# Patient Record
Sex: Male | Born: 1985 | Race: White | Hispanic: Yes | Marital: Married
Health system: Southern US, Community
[De-identification: ages and names within clinical notes are randomized; demographics above are authoritative.]

---

## 2010-08-26 ENCOUNTER — Emergency Department (HOSPITAL_COMMUNITY): Admission: EM | Admit: 2010-08-26 | Discharge: 2010-08-26 | Payer: Self-pay | Admitting: Family Medicine

## 2011-09-23 ENCOUNTER — Other Ambulatory Visit: Payer: Self-pay | Admitting: Geriatric Medicine

## 2011-09-23 DIAGNOSIS — R1030 Lower abdominal pain, unspecified: Secondary | ICD-10-CM

## 2011-09-27 ENCOUNTER — Ambulatory Visit
Admission: RE | Admit: 2011-09-27 | Discharge: 2011-09-27 | Disposition: A | Discharge status: Home / Self Care | Payer: No Typology Code available for payment source | Source: Ambulatory Visit | Attending: Geriatric Medicine | Admitting: Geriatric Medicine

## 2011-09-27 ENCOUNTER — Other Ambulatory Visit: Payer: Self-pay | Admitting: Geriatric Medicine

## 2011-09-27 DIAGNOSIS — R1030 Lower abdominal pain, unspecified: Secondary | ICD-10-CM

## 2013-05-17 IMAGING — US US PELVIS LIMITED
1 series · 14 of 25 positions shown · non-contrast
Comparison: None.

CLINICAL DATA: Groin pain.  Question inguinal hernia.

US PELVIS LIMITED OR FOLLOW UP

[Series 1: us pelvis limited · 0.08mm/px · 14 of 27 slices shown]
[im 1/27]
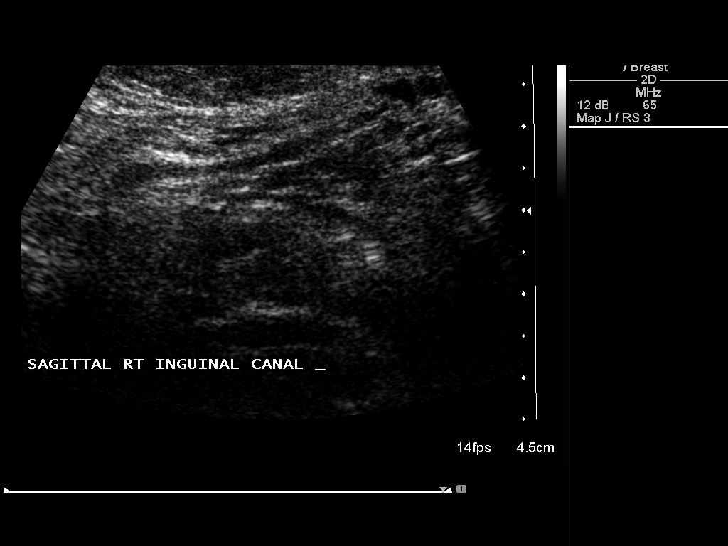
[im 3/27]
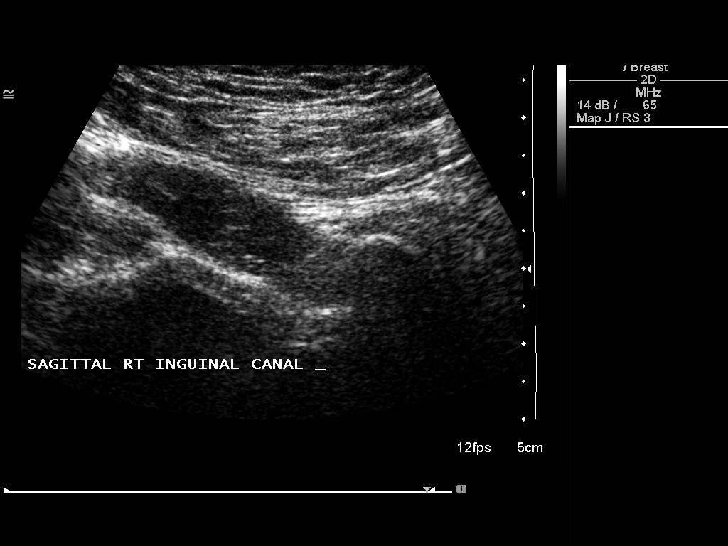
[im 5/27]
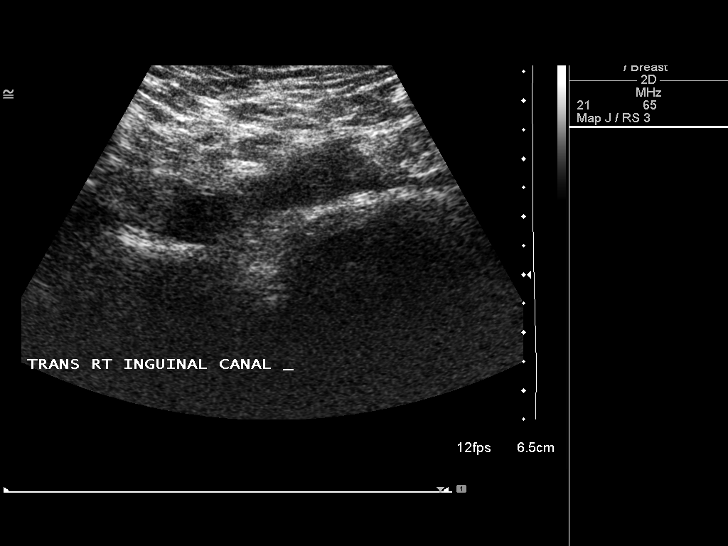
[im 7/27]
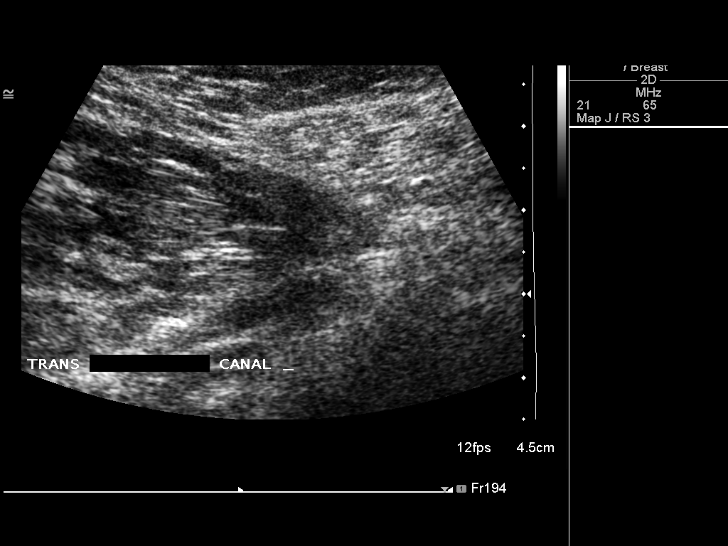
[im 9/27]
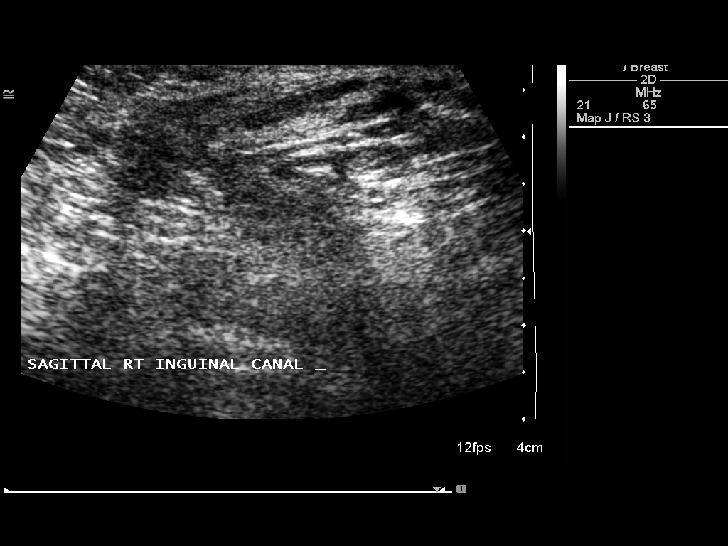
[im 10/27]
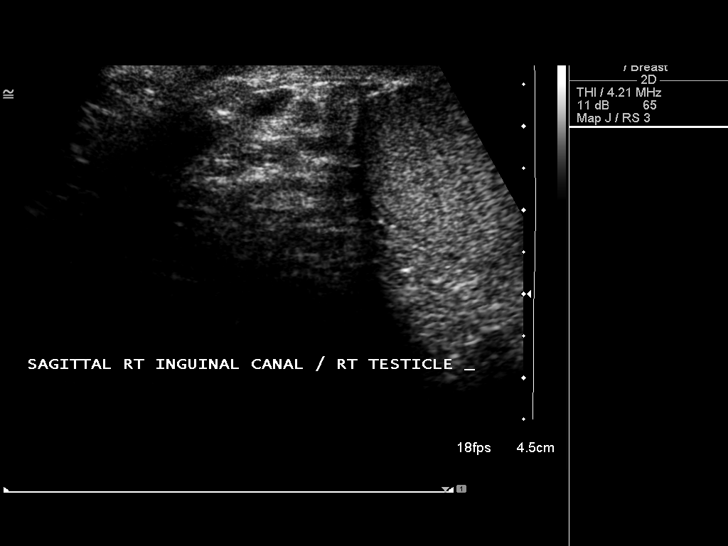
[im 12/27]
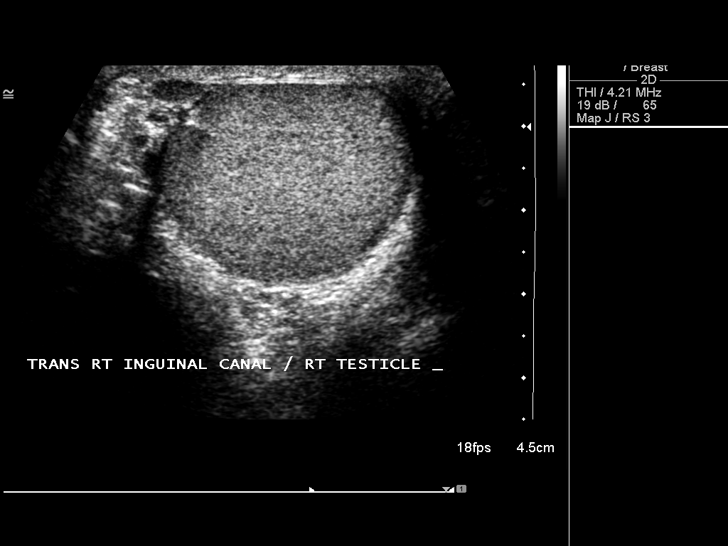
[im 15/27]
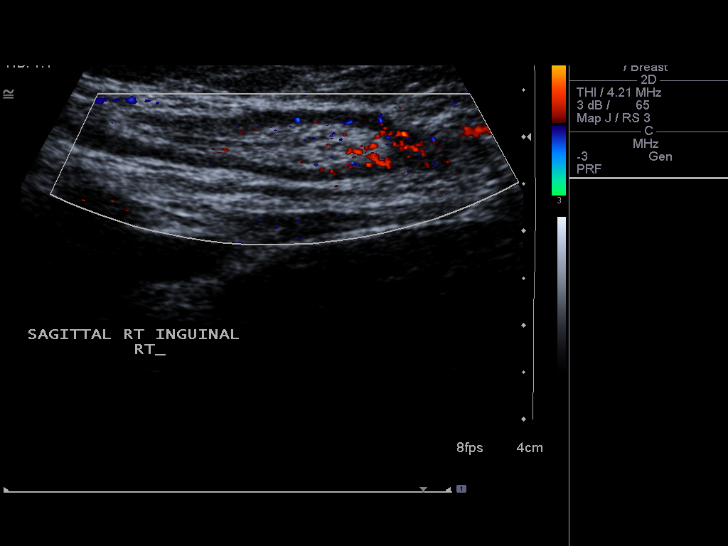
[im 17/27]
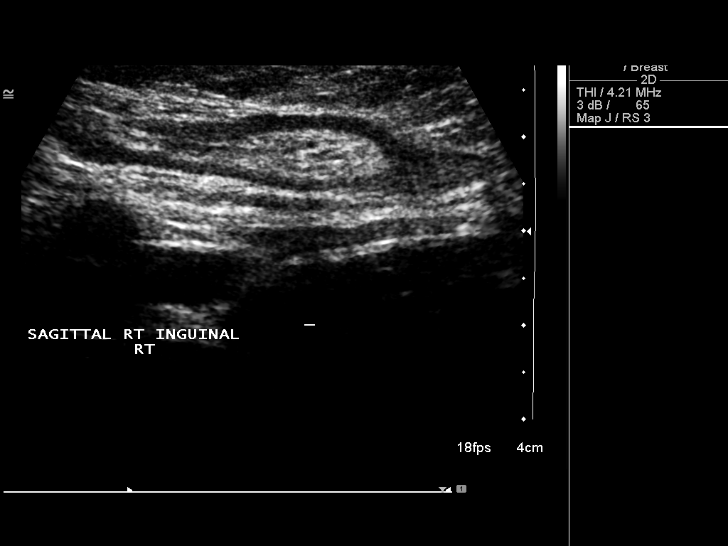
[im 18/27]
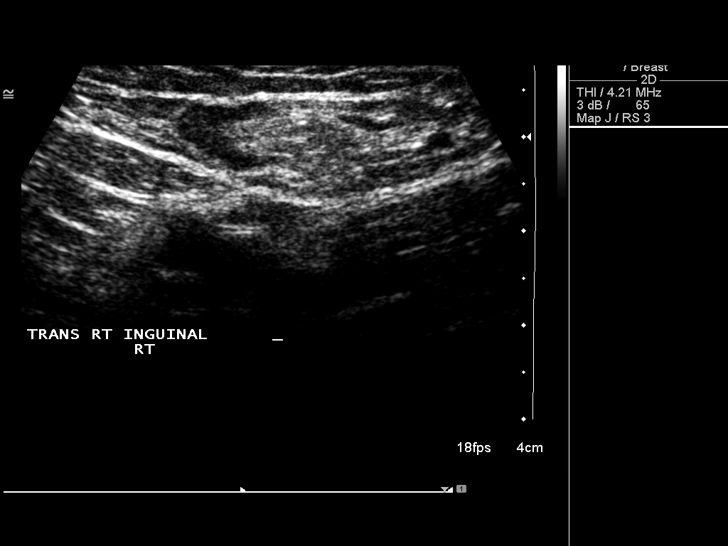
[im 20/27]
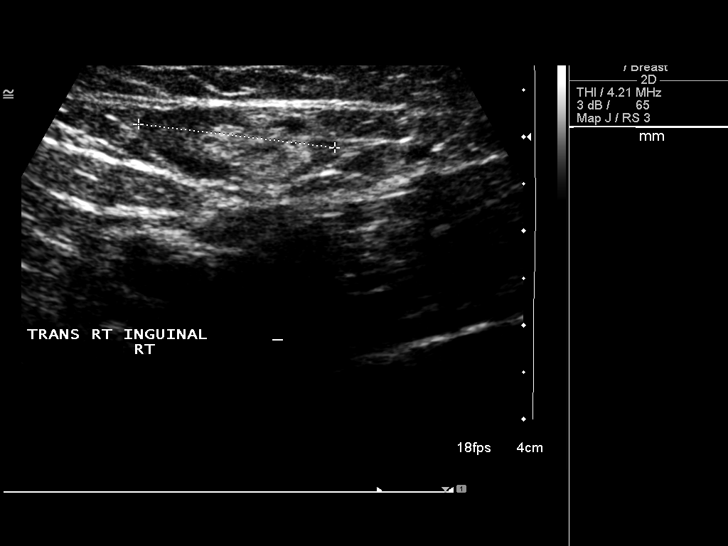
[im 22/27]
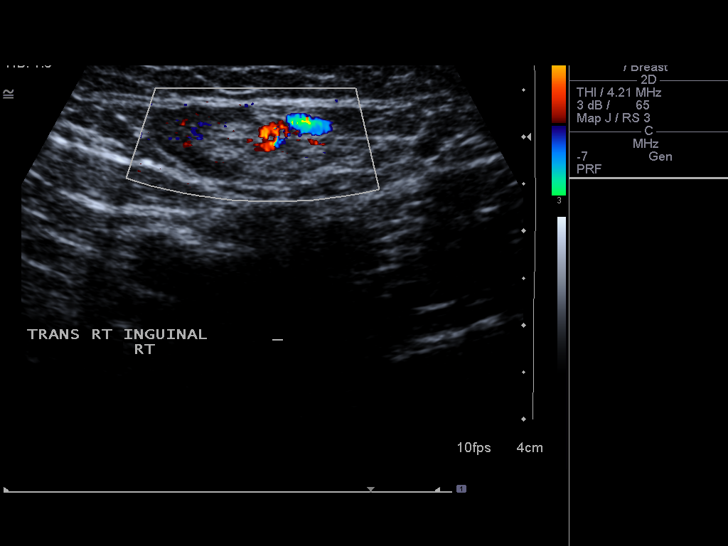
[im 24/27]
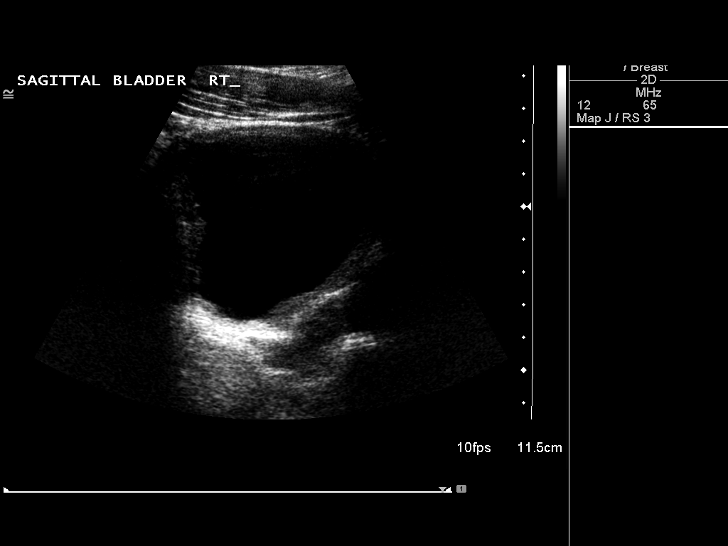
[im 27/27]
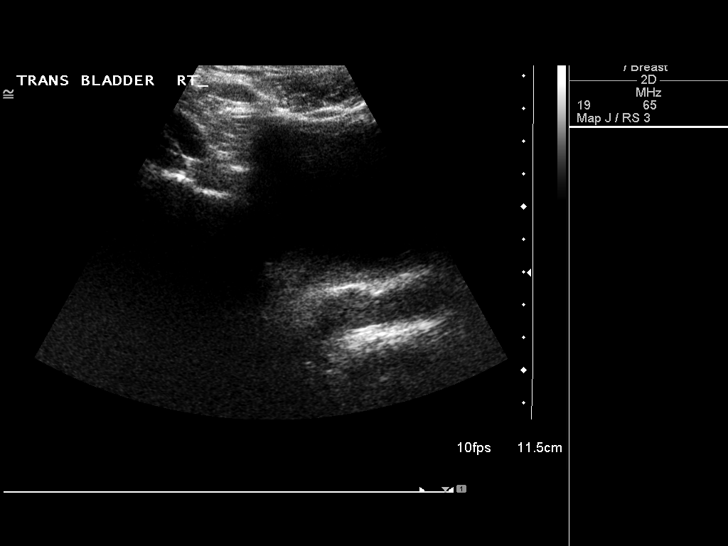

[14 of 25 positions shown; findings below may reference images not displayed]

FINDINGS: No ultrasound evidence of right inguinal hernia.
Incomplete imaging of the right testicle and urinary bladder within
normal limits.  Likely reactive right inguinal lymph node measures
8 mm and maintains its fatty hilum.
IMPRESSION: No ultrasound evidence of inguinal hernia.  Please note that
ultrasound is of low sensitivity for hernias.  If this is an
ongoing clinical concern, the test of choice is contrast enhanced
pelvic CT.

## 2019-10-10 ENCOUNTER — Other Ambulatory Visit: Payer: Self-pay

## 2019-10-10 ENCOUNTER — Emergency Department (HOSPITAL_COMMUNITY): Payer: Self-pay

## 2019-10-10 ENCOUNTER — Emergency Department (HOSPITAL_COMMUNITY)
Admission: EM | Admit: 2019-10-10 | Discharge: 2019-10-10 | Disposition: A | Discharge status: Home / Self Care | Payer: Self-pay | Attending: Emergency Medicine | Admitting: Emergency Medicine

## 2019-10-10 ENCOUNTER — Encounter (HOSPITAL_COMMUNITY): Payer: Self-pay | Admitting: Emergency Medicine

## 2019-10-10 DIAGNOSIS — R1031 Right lower quadrant pain: Secondary | ICD-10-CM | POA: Insufficient documentation

## 2019-10-10 DIAGNOSIS — N50811 Right testicular pain: Secondary | ICD-10-CM | POA: Insufficient documentation

## 2019-10-10 LAB — URINALYSIS, ROUTINE W REFLEX MICROSCOPIC
Bilirubin Urine: NEGATIVE
Glucose, UA: NEGATIVE mg/dL
Hgb urine dipstick: NEGATIVE
Ketones, ur: NEGATIVE mg/dL
Leukocytes,Ua: NEGATIVE
Nitrite: NEGATIVE
Protein, ur: NEGATIVE mg/dL
Specific Gravity, Urine: 1.01 (ref 1.005–1.030)
pH: 6 (ref 5.0–8.0)

## 2019-10-10 MED ORDER — CEFTRIAXONE SODIUM 250 MG IJ SOLR
250.0000 mg | Freq: Once | INTRAMUSCULAR | Status: AC
  Administered 2019-10-10: 250 mg via INTRAMUSCULAR
  Filled 2019-10-10: qty 250

## 2019-10-10 MED ORDER — AZITHROMYCIN 250 MG PO TABS
1000.0000 mg | ORAL_TABLET | Freq: Once | ORAL | Status: AC
  Administered 2019-10-10: 21:00:00 1000 mg via ORAL
  Filled 2019-10-10: qty 4

## 2019-10-10 MED ORDER — IBUPROFEN 600 MG PO TABS: 600.0000 mg | ORAL_TABLET | Freq: Four times a day (QID) | ORAL | 0 refills | Status: AC | PRN

## 2019-10-10 NOTE — Discharge Instructions (Signed)
Tome ibuprofeno 600 mg cada 6 horas segn sea necesario para su dolor. Haga un seguimiento con Estate agent, Dr. Diona Fanti, para una evaluacin y tratamiento adicionales de sus sntomas.  Hoy ha recibido tratamiento por gonorrea y clamidia. Lo llamarn en [place] positivo. En ese caso, informe a todas sus parejas sexuales de que tambin debern ser tratadas. Abstenerse de [employer] sexuales durante una semana hasta que ambos hayan recibido [place]. Use condones en el futuro para ayudar a prevenir enfermedades de transmisin sexual y embarazos no deseados.  Take ibuprofen 600 mg every 6 hours as needed for your pain.  Please follow-up with the urologist, Dr. Diona Fanti, for further evaluation and treatment of your symptoms.  You have been treated for gonorrhea and chlamydia today. You will be called in 3 days if any of your tests return positive. In that case, please make all of your sexual partners aware that they will need to be treated as well. Abstain from intercourse for one week until you have both been treated. Use condoms in the future to help prevent sexually transmitted disease and unwanted pregnancy.

## 2019-10-10 NOTE — ED Triage Notes (Signed)
Pt here with right groin pain for the last 2 weeks. Sent here by [facility] for further eval.

## 2019-10-10 NOTE — ED Provider Notes (Signed)
[facility] EMERGENCY DEPARTMENT Provider Note   CSN: [id number] Arrival date & time: 10/10/19  1638     History   Chief Complaint Chief Complaint  Patient presents with   Testicle Pain    HPI Andrew Mays is a 33 y.o. male who is previously healthy who presents with a 1 week history of intermittent right testicle pain.  He denies any dysuria, penile discharge, significant swelling.  Patient denies any concern for STD exposure.  He has no history of this pain.  He denies any fever, chest pain, shortness of breath abdominal pain, nausea, vomiting.  No interventions tried prior to arrival.  Patient was sent from urgent care for further evaluation.  Per chart review, patient was evaluated with imaging for right inguinal pain in 2012.  No abnormalities found at that time.     HPI  History reviewed. No pertinent past medical history.  There are no active problems to display for this patient.        Home Medications    Prior to Admission medications   Medication Sig Start Date End Date Taking? Authorizing Provider  ibuprofen (ADVIL) 600 MG tablet Take 1 tablet (600 mg total) by mouth every 6 (six) hours as needed. 10/10/19   Emi Holes, PA-C    Family History History reviewed. No pertinent family history.  Social History Social History   Tobacco Use   Smoking status: Not on file  Substance Use Topics   Alcohol use: Not on file   Drug use: Not on file     Allergies   Patient has no known allergies.   Review of Systems Review of Systems  Constitutional: Negative for chills and fever.  HENT: Negative for facial swelling and sore throat.   Respiratory: Negative for shortness of breath.   Cardiovascular: Negative for chest pain.  Gastrointestinal: Negative for abdominal pain, nausea and vomiting.  Genitourinary: Positive for scrotal swelling and testicular pain. Negative for difficulty urinating, discharge, dysuria, flank  pain, frequency, penile pain and urgency.  Musculoskeletal: Negative for back pain.  Skin: Negative for rash and wound.  Neurological: Negative for headaches.  Psychiatric/Behavioral: The patient is not nervous/anxious.      Physical Exam Updated Vital Signs BP 127/77 (BP Location: Right Arm)    Pulse (!) 58    Temp 98.8 F (37.1 C) (Oral)    Resp 16    Ht 5\' 5"  (1.651 m)    Wt 74.4 kg    SpO2 99%    BMI 27.29 kg/m   Physical Exam Vitals signs and nursing note reviewed. Exam conducted with a chaperone present.  Constitutional:      General: He is not in acute distress.    Appearance: He is well-developed. He is not diaphoretic.  HENT:     Head: Normocephalic and atraumatic.     Mouth/Throat:     Pharynx: No oropharyngeal exudate.  Eyes:     General: No scleral icterus.       Right eye: No discharge.        Left eye: No discharge.     Conjunctiva/sclera: Conjunctivae normal.     Pupils: Pupils are equal, round, and reactive to light.  Neck:     Musculoskeletal: Normal range of motion and neck supple.     Thyroid: No thyromegaly.  Cardiovascular:     Rate and Rhythm: Normal rate and regular rhythm.     Heart sounds: Normal heart sounds. No murmur. No friction  rub. No gallop.   Pulmonary:     Effort: Pulmonary effort is normal. No respiratory distress.     Breath sounds: Normal breath sounds. No stridor. No wheezing or rales.  Abdominal:     General: Bowel sounds are normal. There is no distension.     Palpations: Abdomen is soft.     Tenderness: There is no abdominal tenderness. There is no guarding or rebound.     Hernia: There is no hernia in the left inguinal area or right inguinal area.  Genitourinary:    Penis: Normal and uncircumcised. No phimosis, paraphimosis, erythema, tenderness or discharge.      Scrotum/Testes: Normal.        Right: Tenderness or swelling not present.        Left: Tenderness or swelling not present.     Epididymis:     Right: Normal.      Left: Normal.       Comments: Right testicle is high riding than left, but not significantly Mild tenderness where indicated Lymphadenopathy:     Cervical: No cervical adenopathy.     Lower Body: No right inguinal adenopathy. No left inguinal adenopathy.  Skin:    General: Skin is warm and dry.     Coloration: Skin is not pale.     Findings: No rash.  Neurological:     Mental Status: He is alert.     Coordination: Coordination normal.      ED Treatments / Results  Labs (all labs ordered are listed, but only abnormal results are displayed) Labs Reviewed  URINALYSIS, ROUTINE W REFLEX MICROSCOPIC - Abnormal; Notable for the following components:      Result Value   Color, Urine STRAW (*)    All other components within normal limits  GC/CHLAMYDIA PROBE AMP ([place]) NOT AT [facility]    EKG None  Radiology US Scrotum W/doppler  Result Date: 10/10/2019 CLINICAL DATA:  Acute right groin pain EXAM: SCROTAL ULTRASOUND DOPPLER ULTRASOUND OF THE TESTICLES TECHNIQUE: Complete ultrasound examination of the testicles, epididymis, and other scrotal structures was performed. Color and spectral Doppler ultrasound were also utilized to evaluate blood flow to the testicles. COMPARISON:  None. FINDINGS: Right testicle Measurements: 4.4 x 2.5 x 2.0 cm. No mass or microlithiasis visualized. Left testicle Measurements: 4.6 x 2.4 x 2.3 cm. No mass or microlithiasis visualized. Right epididymis:  Normal in size and appearance. Left epididymis:  Normal in size and appearance. Hydrocele:  None visualized. Varicocele:  None visualized. Pulsed Doppler interrogation of both testes demonstrates normal low resistance arterial and venous waveforms bilaterally. IMPRESSION: No evidence of testicular mass or torsion. No definite abnormality seen in the scrotum. Electronically Signed   By: Marijo Conception M.D.   On: 10/10/2019 18:35    Procedures Procedures (including critical care time)  Medications Ordered in  ED Medications  cefTRIAXone (ROCEPHIN) injection 250 mg (250 mg Intramuscular Given 10/10/19 2044)  azithromycin (ZITHROMAX) tablet 1,000 mg (1,000 mg Oral Given 10/10/19 2043)     Initial Impression / Assessment and Plan / ED Course  I have reviewed the triage vital signs and the nursing notes.  Pertinent labs & imaging results that were available during my care of the patient were reviewed by me and considered in my medical decision making (see chart for details).        Patient with right inguinal testicle pain intermittently.  Scrotal ultrasound with Doppler is negative.  UA is negative.  Urine GC/chlamydia sent and pending.  After discussion, patient does not for prophylactic treatment for this, so will cover with gonorrhea chlamydia.  Low suspicion of epididymitis considering negative on ultrasound and no testicular tenderness at this time.  Will refer to urology for further evaluation.  Ibuprofen discussed for pain.  Return precautions discussed.  Patient understands and agrees with plan.  Patient vitals stable throughout ED course and discharged in satisfactory condition.  Final Clinical Impressions(s) / ED Diagnoses   Final diagnoses:  Right inguinal pain  Pain in right testicle    ED Discharge Orders         Ordered    ibuprofen (ADVIL) 600 MG tablet  Every 6 hours PRN     10/10/19 2026           Emi HolesLaw, Jomayra Novitsky M, PA-C 10/10/19 2052    Charlynne PanderYao, David Hsienta, MD 10/10/19 2124

## 2019-10-10 NOTE — ED Notes (Signed)
Patient verbalizes understanding of discharge instructions. Opportunity for questioning and answers were provided. Armband removed by staff, pt discharged from ED.  

## 2019-10-12 LAB — GC/CHLAMYDIA PROBE AMP (~~LOC~~) NOT AT ARMC
Chlamydia: NEGATIVE
Neisseria Gonorrhea: NEGATIVE
